# Patient Record
Sex: Female | Born: 1935 | Race: White | Hispanic: No | Smoking: Never smoker
Health system: Southern US, Community
[De-identification: ages and names within clinical notes are randomized; demographics above are authoritative.]

## PROBLEM LIST (undated history)

## (undated) DIAGNOSIS — I10 Essential (primary) hypertension: Secondary | ICD-10-CM

## (undated) DIAGNOSIS — J40 Bronchitis, not specified as acute or chronic: Secondary | ICD-10-CM

## (undated) DIAGNOSIS — J45909 Unspecified asthma, uncomplicated: Secondary | ICD-10-CM

## (undated) DIAGNOSIS — E785 Hyperlipidemia, unspecified: Secondary | ICD-10-CM

## (undated) HISTORY — PX: AORTIC VALVE REPAIR: SHX6306

## (undated) HISTORY — PX: REPLACEMENT TOTAL KNEE: SUR1224

---

## 2008-08-14 ENCOUNTER — Emergency Department (HOSPITAL_COMMUNITY): Admission: EM | Admit: 2008-08-14 | Discharge: 2008-08-14 | Payer: Self-pay | Admitting: Emergency Medicine

## 2008-08-15 ENCOUNTER — Ambulatory Visit (HOSPITAL_COMMUNITY): Admission: EM | Admit: 2008-08-15 | Discharge: 2008-08-15 | Payer: Self-pay | Admitting: Emergency Medicine

## 2010-05-28 LAB — COMPREHENSIVE METABOLIC PANEL
ALT: 17 U/L (ref 0–35)
Albumin: 3.4 g/dL — ABNORMAL LOW (ref 3.5–5.2)
BUN: 28 mg/dL — ABNORMAL HIGH (ref 6–23)
Chloride: 110 mEq/L (ref 96–112)
GFR calc non Af Amer: 41 mL/min — ABNORMAL LOW (ref >60)
Glucose, Bld: 115 mg/dL — ABNORMAL HIGH (ref 70–99)
Potassium: 4.4 mEq/L (ref 3.5–5.1)
Sodium: 141 mEq/L (ref 135–145)

## 2010-05-28 LAB — URINALYSIS, ROUTINE W REFLEX MICROSCOPIC
Bilirubin Urine: NEGATIVE
Glucose, UA: NEGATIVE mg/dL
Ketones, ur: NEGATIVE mg/dL
Nitrite: NEGATIVE
Nitrite: NEGATIVE
Protein, ur: 100 mg/dL — AB
Protein, ur: 100 mg/dL — AB
Specific Gravity, Urine: 1.02 (ref 1.005–1.030)
Specific Gravity, Urine: 1.028 (ref 1.005–1.030)
Urobilinogen, UA: 0.2 mg/dL (ref 0.0–1.0)
Urobilinogen, UA: 0.2 mg/dL (ref 0.0–1.0)
pH: 5 (ref 5.0–8.0)

## 2010-05-28 LAB — CBC
HCT: 33.8 % — ABNORMAL LOW (ref 36.0–46.0)
HCT: 34.1 % — ABNORMAL LOW (ref 36.0–46.0)
Hemoglobin: 11.4 g/dL — ABNORMAL LOW (ref 12.0–15.0)
Hemoglobin: 11.4 g/dL — ABNORMAL LOW (ref 12.0–15.0)
MCHC: 33.8 g/dL (ref 30.0–36.0)
MCV: 90.7 fL (ref 78.0–100.0)
Platelets: 172 K/uL (ref 150–400)
Platelets: 203 10*3/uL (ref 150–400)
RBC: 3.73 MIL/uL — ABNORMAL LOW (ref 3.87–5.11)
RDW: 13.8 % (ref 11.5–15.5)
WBC: 9.2 10*3/uL (ref 4.0–10.5)
WBC: 9.9 10*3/uL (ref 4.0–10.5)

## 2010-05-28 LAB — BASIC METABOLIC PANEL WITH GFR
BUN: 30 mg/dL — ABNORMAL HIGH (ref 6–23)
CO2: 24 meq/L (ref 19–32)
Calcium: 9.5 mg/dL (ref 8.4–10.5)
Creatinine, Ser: 1 mg/dL (ref 0.4–1.2)
GFR calc non Af Amer: 54 mL/min — ABNORMAL LOW (ref >60)
Glucose, Bld: 117 mg/dL — ABNORMAL HIGH (ref 70–99)
Sodium: 137 meq/L (ref 135–145)

## 2010-05-28 LAB — DIFFERENTIAL
Basophils Absolute: 0 K/uL (ref 0.0–0.1)
Basophils Relative: 0 % (ref 0–1)
Eosinophils Absolute: 0.2 10*3/uL (ref 0.0–0.7)
Eosinophils Relative: 2 % (ref 0–5)
Eosinophils Relative: 2 % (ref 0–5)
Lymphocytes Relative: 13 % (ref 12–46)
Lymphocytes Relative: 9 % — ABNORMAL LOW (ref 12–46)
Lymphs Abs: 0.9 10*3/uL (ref 0.7–4.0)
Lymphs Abs: 1.2 10*3/uL (ref 0.7–4.0)
Monocytes Absolute: 0.6 K/uL (ref 0.1–1.0)
Monocytes Relative: 6 % (ref 3–12)
Neutro Abs: 8.2 K/uL — ABNORMAL HIGH (ref 1.7–7.7)
Neutrophils Relative %: 76 % (ref 43–77)
Neutrophils Relative %: 83 % — ABNORMAL HIGH (ref 43–77)

## 2010-05-28 LAB — URINE CULTURE
Colony Count: NO GROWTH
Culture: NO GROWTH

## 2010-05-28 LAB — POCT I-STAT, CHEM 8
Chloride: 108 mEq/L (ref 96–112)
HCT: 33 % — ABNORMAL LOW (ref 36.0–46.0)
Potassium: 4.3 mEq/L (ref 3.5–5.1)

## 2010-05-28 LAB — PROTIME-INR
INR: 1 (ref 0.00–1.49)
Prothrombin Time: 13.7 seconds (ref 11.6–15.2)

## 2010-05-28 LAB — URINE MICROSCOPIC-ADD ON

## 2010-05-28 LAB — BASIC METABOLIC PANEL
Chloride: 106 mEq/L (ref 96–112)
GFR calc Af Amer: >60 mL/min (ref >60)
Potassium: 4.7 mEq/L (ref 3.5–5.1)

## 2010-05-29 IMAGING — CT CT ABDOMEN W/O CM
2 of 4 series · 17 of 46 positions shown, 19 images · non-contrast
Comparison: None

CT ABDOMEN

CLINICAL DATA: Left flank pain

CT OF THE ABDOMEN AND PELVIS WITHOUT CONTRAST (CT UROGRAM)
TECHNIQUE: Multidetector CT imaging was performed through the
abdomen and pelvis to include the urinary tract.

[Series 2: stone_wo 5.0 b40f st · axial · 0.77mm/px · z∈[-488,-84]mm · 14 of 89 slices shown, 16 images]
[im 4/89  soft-tissue]
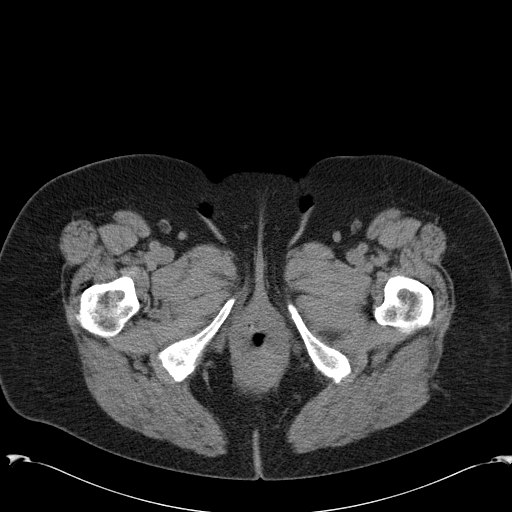
[im 4/89  bone]
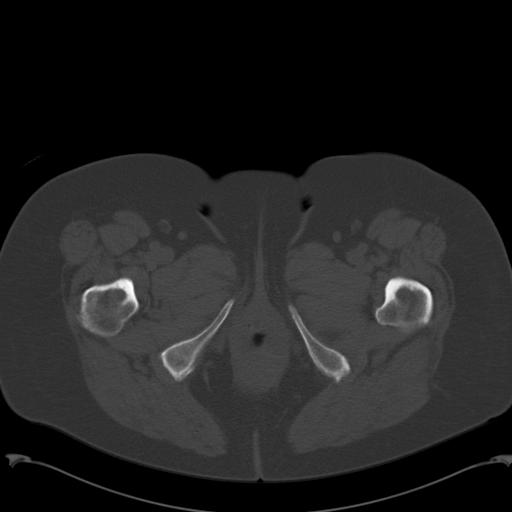
[im 12/89  soft-tissue]
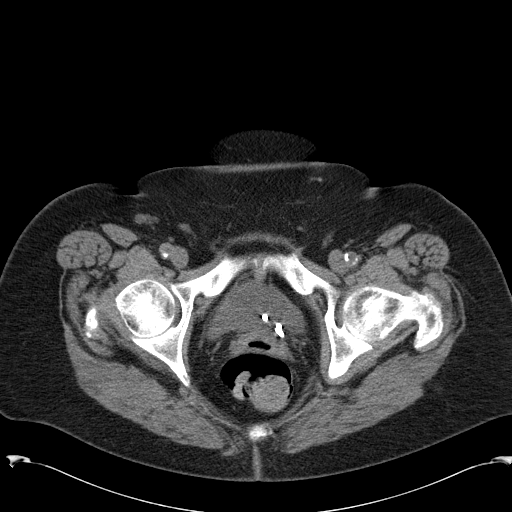
[im 19/89  soft-tissue]
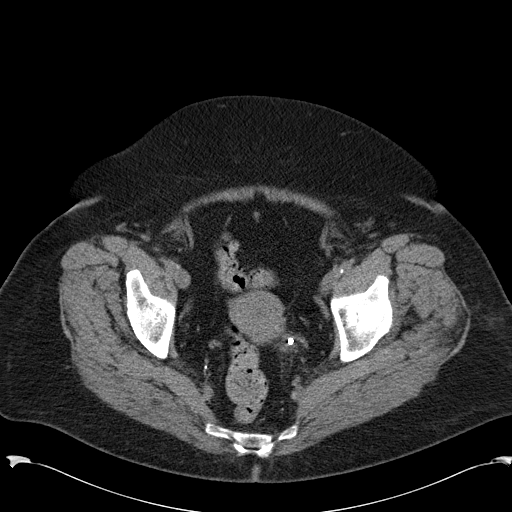
[im 23/89  soft-tissue]
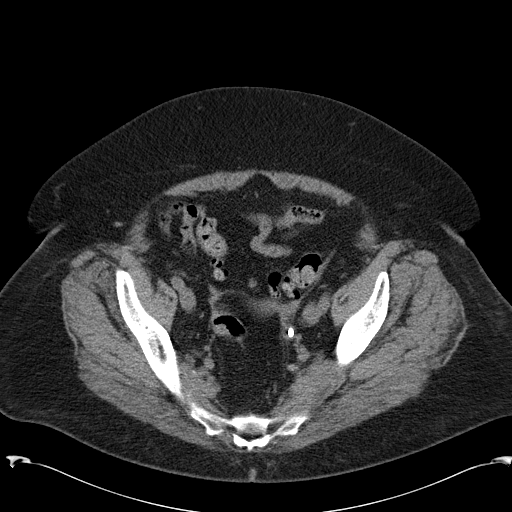
[im 30/89  soft-tissue]
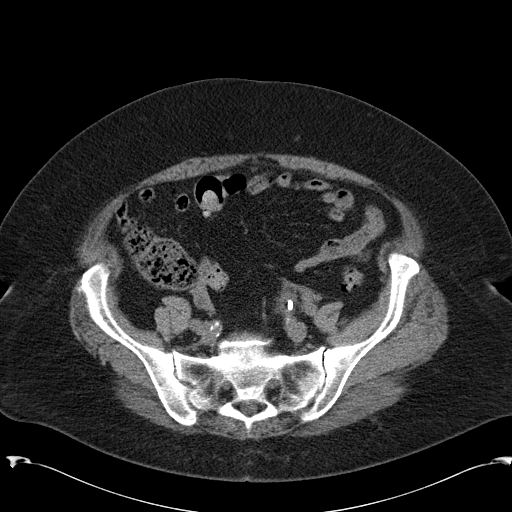
[im 37/89  soft-tissue]
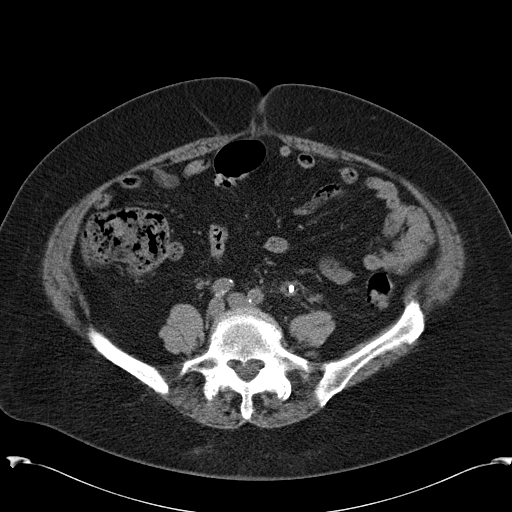
[im 41/89  soft-tissue]
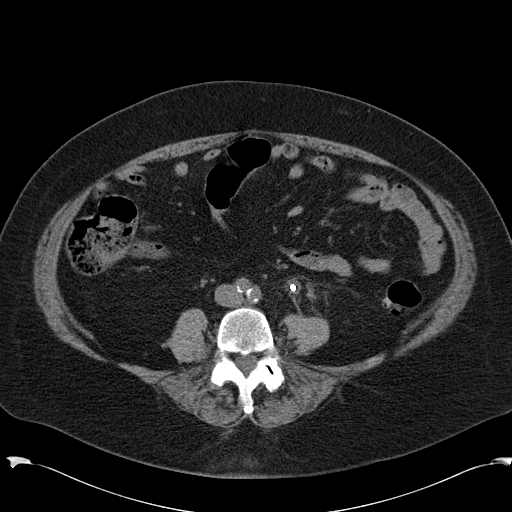
[im 48/89  soft-tissue]
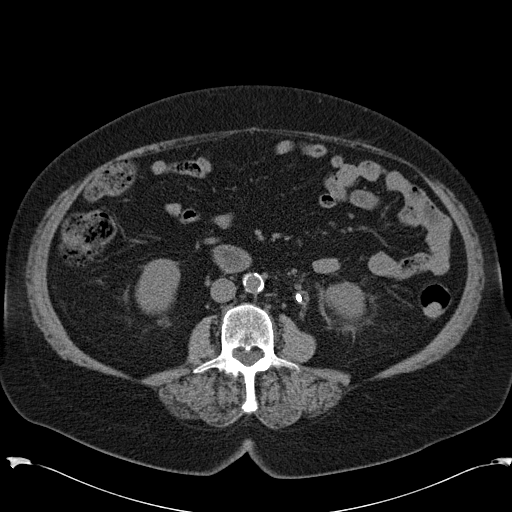
[im 52/89  soft-tissue]
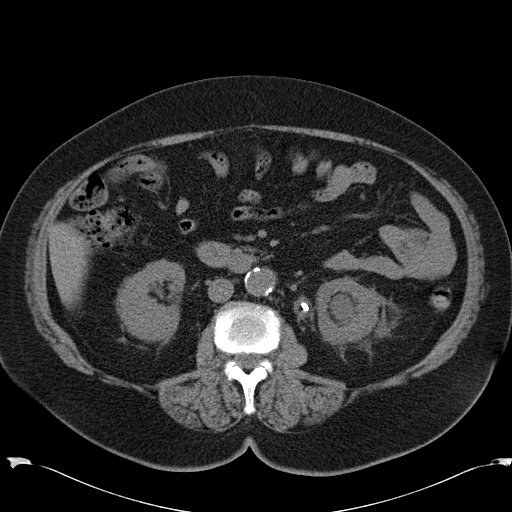
[im 52/89  bone]
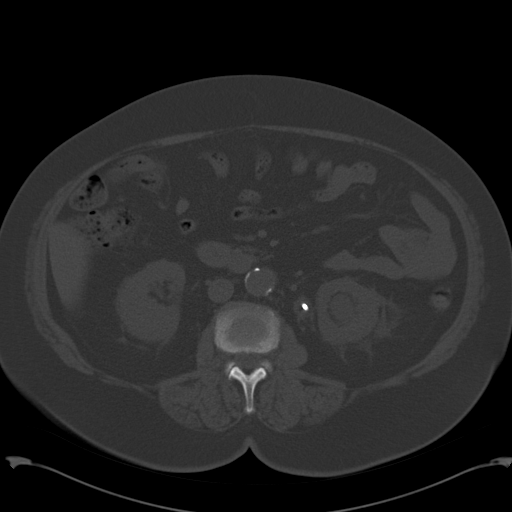
[im 59/89  soft-tissue]
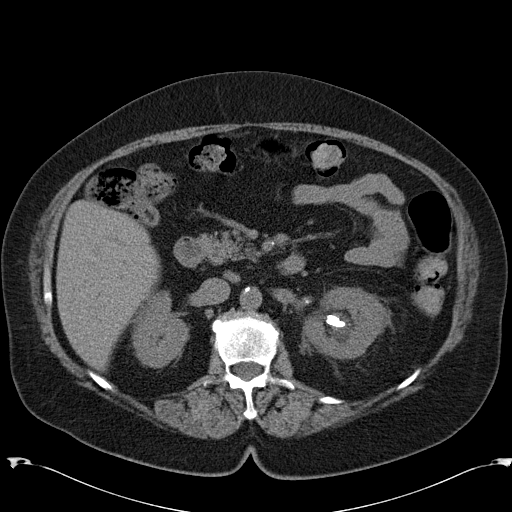
[im 67/89  soft-tissue]
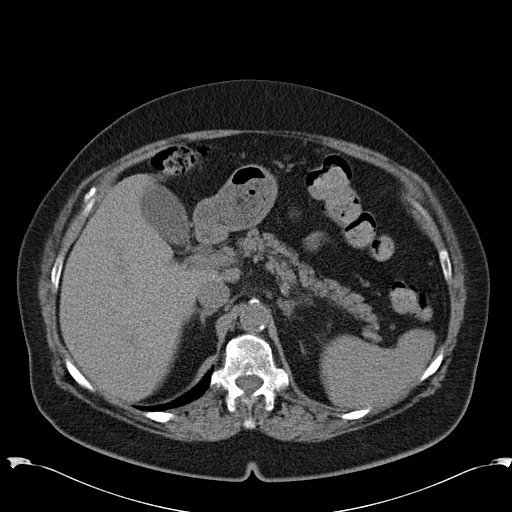
[im 70/89  soft-tissue]
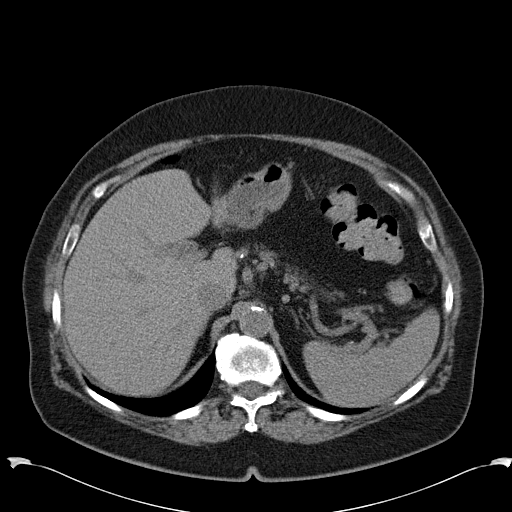
[im 78/89  soft-tissue]
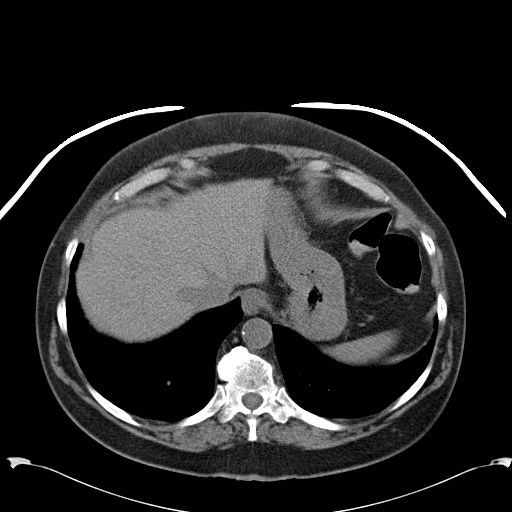
[im 85/89  soft-tissue]
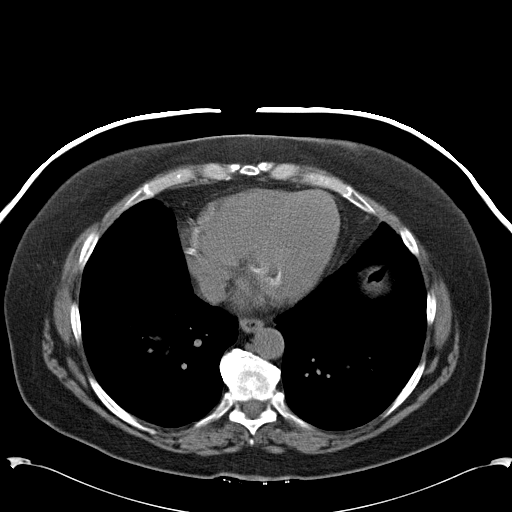

[Series 602: coronal abdomen · coronal · 0.90mm/px · 3 of 133 slices shown]
[im 45/133  soft-tissue]
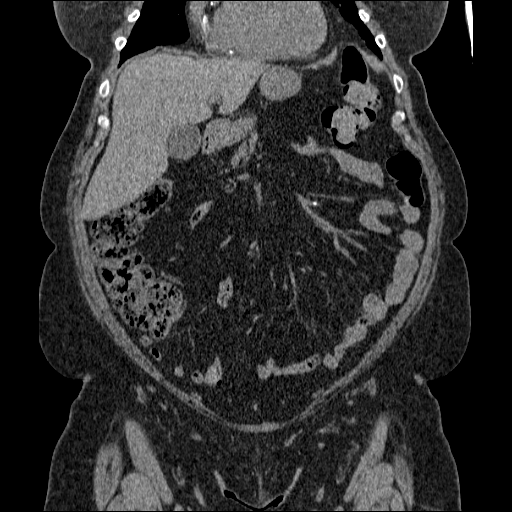
[im 59/133  soft-tissue]
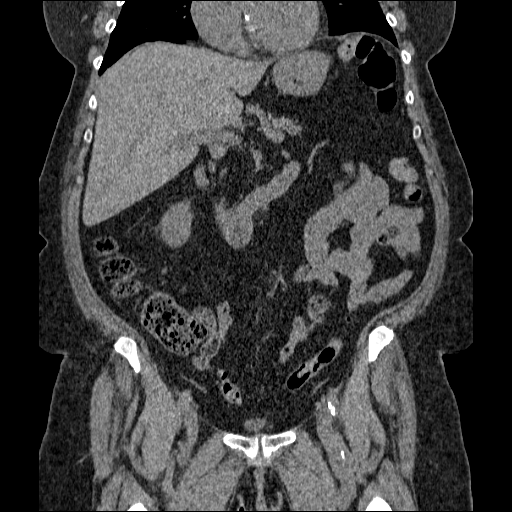
[im 74/133  soft-tissue]
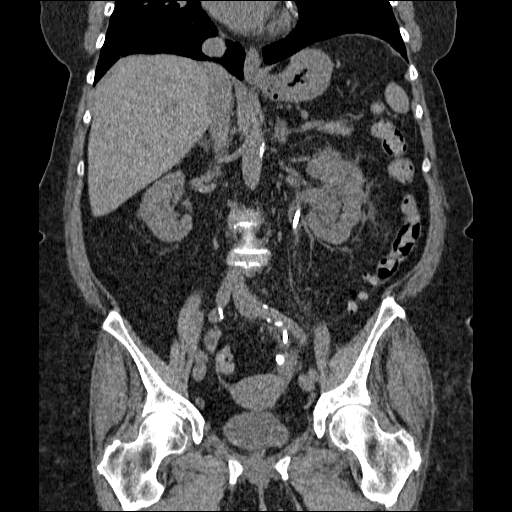

[17 of 46 positions shown; findings below may reference images not displayed]

FINDINGS: Dense coronary artery and mitral valve calcifications
noted.  Lung bases clear.  No effusions.

Left ureteral stent is in place.  There is moderate left
hydronephrosis and perinephric stranding.  No hydronephrosis on the
right.

Liver, spleen, pancreas, adrenals, gallbladder unremarkable. Bowel
grossly unremarkable.  No free fluid, free air, or adenopathy.

Degenerative changes in the lumbar spine.
IMPRESSION: Moderate left hydronephrosis with left ureteral stent in place.

Coronary artery disease.

CT PELVIS
FINDINGS: Left ureteral stent ends in the bladder.  Bladder is
decompressed.  Uterus and adnexa unremarkable. Bowel grossly
unremarkable.  No free fluid, free air, or adenopathy. No acute
bony abnormality.
IMPRESSION: Left ureteral stent ends in the bladder.

No acute findings in the pelvis.

## 2010-07-03 NOTE — Op Note (Signed)
[name], [name] NO.:  [id number]   MEDICAL RECORD NO.:  [id number]          PATIENT TYPE:  EMS   LOCATION:  ED                           FACILITY:  [facility]   PHYSICIAN:  Courtney Paris, M.D.DATE OF BIRTH:  Dec 09, 1929   DATE OF PROCEDURE:  DATE OF DISCHARGE:                               OPERATIVE REPORT   ADDENDUM:   DICTATION NUMBER:  [id number].  Send report to noted physician.      Courtney Paris, M.D.  Electronically Signed     HMK/MEDQ  D:  08/15/2008  T:  08/15/2008  Job:  [id number]   cc:   Magdalene Patricia, MD  [address]  [address]  [place], [place] [address]  Fax #[phone]

## 2010-07-03 NOTE — Op Note (Signed)
[name], [name]              ACCOUNT NO.:  [id number]   MEDICAL RECORD NO.:  [id number]          PATIENT TYPE:  EMS   LOCATION:  ED                           FACILITY:  [facility]   PHYSICIAN:  Courtney Paris, M.D.DATE OF BIRTH:  [birth year]   DATE OF PROCEDURE:  08/15/2008  DATE OF DISCHARGE:                               OPERATIVE REPORT   PREOPERATIVE DIAGNOSES:  Left hydronephrosis with ureteral stent.   POSTOPERATIVE DIAGNOSES:  1. Left hydronephrosis with ureteral stent.  2. Obstructed left ureteral stent.   ANESTHESIA:  General.   SURGEON:  Courtney Paris, M.D.   PROCEDURE:  Cystoscopy and removed of left ureteral obstructed stent.   BRIEF HISTORY:  The patient is a 77 year old white female from  [place] visiting in [place], who has had two visits to the  emergency room with left flank pain.  She had a stone with a stent on  the left in September 2009.  The stent was removed after 3 weeks, but  reinserted 3 weeks ago due to questionable obstruction.  CT scan in the  emergency room showed left hydronephrosis.  The stent was in good  position, but there was no stone.  She is admitted now for cystoscopy  and exchange of left ureteral stent before she travels back to  [place].   The patient was placed on the operating table in dorsal lithotomy  position.  After satisfactory induction of general endotracheal  anesthesia, she was prepped and draped with Betadine in the usual  sterile fashion.  She had gotten Cipro IV just prior to the procedure.  A timeout was performed and the patient and procedure were then  reconfirmed.  The 21 panendoscope was inserted in the bladder and the  bladder carefully inspected.  There were no bladder mucosal lesions  seen.  The stent coming out the left ureteral orifice had some  calcifications, but also had a string stent attached to it that had been  cut, but still attached to the end of the stent.  This was grasped  and  pulled outside the urethral meatus.  The end of the stent was then cut  off and there was a hydronephrotic drip obtained.  The guidewire was  then passed under fluoroscopy up to the level of the kidney and the  stent was then removed.  Over the guidewire a new 6-French x 24 cm  length double-J ureteral stent was then passed also under fluoroscopy up  to level of the kidney.  When the guidewire was removed, the stent  seemed to be in good position.  There was a nice coil in the renal  pelvis and one in the bladder.  Pictures were made of the preoperative  and postoperative stent.  The bladder was drained, B and O suppository  inserted.  She was given 30 mg of Toradol.  She will be later discharged  as an outpatient.  She was sent to the recovery room in good condition.      Courtney Paris, M.D.  Electronically Signed    HMK/MEDQ  D:  08/15/2008  T:  08/15/2008  Job:  [id number]

## 2010-07-03 NOTE — Consult Note (Signed)
[name], [name] NO.:  [id number]   MEDICAL RECORD NO.:  [id number]          PATIENT TYPE:  EMS   LOCATION:  ED                           FACILITY:  [facility]   PHYSICIAN:  Theodosia Paling, MD    DATE OF BIRTH:  1930-01-12   DATE OF CONSULTATION:  08/15/2008  DATE OF DISCHARGE:                                 CONSULTATION   PRIMARY CARE PHYSICIAN:  Unassigned.  The patient is visiting from  [place].   REASON FOR CONSULTATION:  Preoperative clearance and management of  comorbid medical condition.   HISTORY OF PRESENT ILLNESS:  [name] is a very pleasant 77-  year-old lady with a past medical history of CAD and hypertension and a  recent history of left-sided ureteral stent placement 2 weeks back was  in her usual state of health until around 3 days back when she had 10/10  left frank pain radiating to the back.  She presented to the emergency  room with flank pain and was discharged with pain medications and  antibiotics.  However, her pain persisted.  This prompted her to arrive  to emergency room for further evaluation and management.  In the ER she  underwent imaging which revealed left-sided hydronephrosis.  Urology was  consulted who is going to perform the repositioning or stent placement  and address issue of hydronephrosis.  [facility] team was  contacted for consultation for preoperative clearance and for medical  management.   REVIEW OF SYSTEMS:  Essentially negative except for what is mentioned in  HPI.  In addition the patient feels intermittent nausea as well.   PAST MEDICAL HISTORY:  1. Significant for coronary artery disease 2 years back.  According to      her she has 2 coronary stents placed.  2. History of hypertension.  3. History of hyperlipidemia.   PAST SURGICAL HISTORY:  1. Two weeks back left kidney stent placed in the left ureter.  2. History of tonsillectomy.  3. History of appendectomy.   SOCIAL  HISTORY:  Denies history of tobacco, alcohol or IV drug abuse.  Lives alone and is very functional.   ALLERGIES:  No known drug allergies.   HOME MEDICATIONS:  The patient does not remember the dosages, but she  takes the following medications.  1. Oxybutynin.  2. Metoprolol.  3. Crestor.   FAMILY HISTORY:  Both parents died of heart disease, she is not sure  what kind of disease they had.   PHYSICAL EXAMINATION:  VITAL SIGNS:  Blood pressure 152/60, heart rate  44, respiratory rate 20, afebrile temperature 97.9, heart rate is sinus  bradycardia.  GENERAL:  No acute cardiorespiratory distress.  HEENT:  No ear or nose discharge.  EOM intact.  LUNGS:  Normal breath sounds.  CARDIOVASCULAR:  S1-S2 normal.  No murmur or gallop heard.  ABDOMEN:  Nontender.  No organomegaly appreciated.  EXTREMITIES:  No pedal edema.  PSYCH:  Oriented x3.  NEUROLOGY:  Motor, sensory and speech intact.  Follows commands.   LABORATORY DATA:  WBC 9.9, hemoglobin 11.4, hematocrit 33.8, platelet  count 172.  Sodium 137, potassium 4.7, chloride 106, bicarbonate 24,  glucose 117, BUN 30, creatinine 1.0, calcium 9.4.  Urinalysis showing  large blood, 100 protein, small leukocytes, many squamous cells, many  bacteria.   IMAGING:  CT scan of the abdomen and pelvis performed on August 14, 2008  showing left ureteral stent being in the bladder, moderate left  hydronephrosis.   ASSESSMENT/PLAN:  1. Hydronephrosis.  Urology is planning to do the procedure this      afternoon in terms of her hydronephrosis.  At this time there is no      evidence of acute renal failure or sepsis.  We will continue to      monitor closely and continue IV antibiotics as recommended by      urology.  2. Hypertension.  Stable at this time.  3. Coronary artery disease.  Stable at this time.  4. Anemia.  The patient appears to be anemic.  This appears to be      normocytic anemia.  This can be worked up as an outpatient with the       primary care Arnold Kester.  5. Prophylaxis.  Deep venous thrombosis and gastrointestinal      prophylaxis is recommended.   Total time spent in consult of this patient was around 1 hour.  The  patient has moderate risk for surgery given age and comorbid condition.  We will continue to follow postoperatively with you.      Theodosia Paling, MD  Electronically Signed     NP/MEDQ  D:  08/15/2008  T:  08/15/2008  Job:  [id number]   cc:   Courtney Paris, M.D.  Fax: [phone]

## 2017-02-24 ENCOUNTER — Encounter (HOSPITAL_BASED_OUTPATIENT_CLINIC_OR_DEPARTMENT_OTHER): Payer: Self-pay | Admitting: Adult Health

## 2017-02-24 ENCOUNTER — Emergency Department (HOSPITAL_BASED_OUTPATIENT_CLINIC_OR_DEPARTMENT_OTHER)
Admission: EM | Admit: 2017-02-24 | Discharge: 2017-02-24 | Disposition: A | Discharge status: Home / Self Care | Payer: Medicare Other | Attending: Emergency Medicine | Admitting: Emergency Medicine

## 2017-02-24 ENCOUNTER — Other Ambulatory Visit: Payer: Self-pay

## 2017-02-24 ENCOUNTER — Emergency Department (HOSPITAL_BASED_OUTPATIENT_CLINIC_OR_DEPARTMENT_OTHER): Payer: Medicare Other

## 2017-02-24 DIAGNOSIS — J189 Pneumonia, unspecified organism: Secondary | ICD-10-CM

## 2017-02-24 DIAGNOSIS — J181 Lobar pneumonia, unspecified organism: Secondary | ICD-10-CM | POA: Diagnosis not present

## 2017-02-24 DIAGNOSIS — J45909 Unspecified asthma, uncomplicated: Secondary | ICD-10-CM | POA: Diagnosis not present

## 2017-02-24 DIAGNOSIS — R509 Fever, unspecified: Secondary | ICD-10-CM | POA: Diagnosis present

## 2017-02-24 DIAGNOSIS — J111 Influenza due to unidentified influenza virus with other respiratory manifestations: Secondary | ICD-10-CM

## 2017-02-24 DIAGNOSIS — Z7982 Long term (current) use of aspirin: Secondary | ICD-10-CM | POA: Insufficient documentation

## 2017-02-24 DIAGNOSIS — Z79899 Other long term (current) drug therapy: Secondary | ICD-10-CM | POA: Diagnosis not present

## 2017-02-24 DIAGNOSIS — I10 Essential (primary) hypertension: Secondary | ICD-10-CM | POA: Insufficient documentation

## 2017-02-24 DIAGNOSIS — R69 Illness, unspecified: Secondary | ICD-10-CM

## 2017-02-24 HISTORY — DX: Unspecified asthma, uncomplicated: J45.909

## 2017-02-24 HISTORY — DX: Bronchitis, not specified as acute or chronic: J40

## 2017-02-24 HISTORY — DX: Essential (primary) hypertension: I10

## 2017-02-24 HISTORY — DX: Hyperlipidemia, unspecified: E78.5

## 2017-02-24 LAB — COMPREHENSIVE METABOLIC PANEL
ALBUMIN: 3.4 g/dL — AB (ref 3.5–5.0)
ALT: 21 U/L (ref 14–54)
AST: 30 U/L (ref 15–41)
Alkaline Phosphatase: 72 U/L (ref 38–126)
Anion gap: 9 (ref 5–15)
BUN: 20 mg/dL (ref 6–20)
CHLORIDE: 103 mmol/L (ref 101–111)
CO2: 23 mmol/L (ref 22–32)
Calcium: 8.8 mg/dL — ABNORMAL LOW (ref 8.9–10.3)
Creatinine, Ser: 1.06 mg/dL — ABNORMAL HIGH (ref 0.44–1.00)
GFR calc Af Amer: 54 mL/min — ABNORMAL LOW (ref >60)
GFR calc non Af Amer: 47 mL/min — ABNORMAL LOW (ref >60)
GLUCOSE: 133 mg/dL — AB (ref 65–99)
POTASSIUM: 3.8 mmol/L (ref 3.5–5.1)
Sodium: 135 mmol/L (ref 135–145)
Total Bilirubin: 1 mg/dL (ref 0.3–1.2)
Total Protein: 6.2 g/dL — ABNORMAL LOW (ref 6.5–8.1)

## 2017-02-24 LAB — CBC WITH DIFFERENTIAL/PLATELET
Basophils Absolute: 0.1 10*3/uL (ref 0.0–0.1)
Basophils Relative: 1 %
EOS PCT: 1 %
Eosinophils Absolute: 0.1 10*3/uL (ref 0.0–0.7)
HCT: 33.3 % — ABNORMAL LOW (ref 36.0–46.0)
Hemoglobin: 10.9 g/dL — ABNORMAL LOW (ref 12.0–15.0)
LYMPHS ABS: 0.5 10*3/uL — AB (ref 0.7–4.0)
Lymphocytes Relative: 6 %
MCH: 30.4 pg (ref 26.0–34.0)
MCHC: 32.7 g/dL (ref 30.0–36.0)
MCV: 92.8 fL (ref 78.0–100.0)
MONO ABS: 1 10*3/uL (ref 0.1–1.0)
Monocytes Relative: 12 %
Neutro Abs: 6.7 10*3/uL (ref 1.7–7.7)
Neutrophils Relative %: 80 %
PLATELETS: 136 10*3/uL — AB (ref 150–400)
RBC: 3.59 MIL/uL — ABNORMAL LOW (ref 3.87–5.11)
RDW: 13.8 % (ref 11.5–15.5)
WBC: 8.4 10*3/uL (ref 4.0–10.5)

## 2017-02-24 LAB — I-STAT CG4 LACTIC ACID, ED: Lactic Acid, Venous: 0.62 mmol/L (ref 0.5–1.9)

## 2017-02-24 MED ORDER — LEVOFLOXACIN 500 MG PO TABS: 500.0000 mg | ORAL_TABLET | Freq: Every day | ORAL | 0 refills | Status: AC

## 2017-02-24 MED ORDER — SODIUM CHLORIDE 0.9 % IV BOLUS (SEPSIS)
500.0000 mL | Freq: Once | INTRAVENOUS | Status: AC
  Administered 2017-02-24: 500 mL via INTRAVENOUS

## 2017-02-24 MED ORDER — LEVOFLOXACIN 500 MG PO TABS
500.0000 mg | ORAL_TABLET | Freq: Once | ORAL | Status: AC
  Administered 2017-02-24: 500 mg via ORAL
  Filled 2017-02-24: qty 1

## 2017-02-24 MED ORDER — OSELTAMIVIR PHOSPHATE 75 MG PO CAPS: 75.0000 mg | ORAL_CAPSULE | Freq: Two times a day (BID) | ORAL | 0 refills | Status: AC

## 2017-02-24 MED ORDER — ACETAMINOPHEN 325 MG PO TABS
650.0000 mg | ORAL_TABLET | Freq: Once | ORAL | Status: AC
  Administered 2017-02-24: 650 mg via ORAL
  Filled 2017-02-24: qty 2

## 2017-02-24 NOTE — ED Notes (Signed)
Ambulating with RT, alert, NAD, calm, steady gait. Family present.

## 2017-02-24 NOTE — ED Provider Notes (Signed)
[facility] EMERGENCY DEPARTMENT Provider Note   CSN: [id number] Arrival date & time: 02/24/17  0026     History   Chief Complaint Chief Complaint  Patient presents with  . Fever    HPI [name] is a 84 y.o. female.  HPI  This is an 84 year old female with a history of hypertension, hyperlipidemia, asthma, colon cancer who presents with myalgias, fever, and cough.  Onset of symptoms over the last 24 hours.  Patient reports cold chills at home and generalized myalgias.  She did take Tylenol with minimal relief.  She reports cough productive of green sputum.  No recent hospitalizations.  She is traveling and visiting her sister.  Denies any chest pain.  She does report some shortness of breath.  No nausea, vomiting, abdominal pain, urinary symptoms.  She did not receive a flu shot this year.  Temperature noted to be 101.7 in triage.  Past Medical History:  Diagnosis Date  . Asthma   . Bronchitis   . Hyperlipidemia   . Hypertension     There are no active problems to display for this patient.   Past Surgical History:  Procedure Laterality Date  . AORTIC VALVE REPAIR    . REPLACEMENT TOTAL KNEE      OB History    No data available       Home Medications    Prior to Admission medications   Medication Sig Start Date End Date Taking? Authorizing Provider  aspirin 81 MG chewable tablet Chew 81 mg by mouth daily.   Yes [provider]  metoprolol tartrate (LOPRESSOR) 25 MG tablet Take 25 mg by mouth 2 (two) times daily.   Yes [provider]  rosuvastatin (CRESTOR) 10 MG tablet Take 10 mg by mouth daily.   Yes [provider]  levofloxacin (LEVAQUIN) 500 MG tablet Take 1 tablet (500 mg total) by mouth daily. 02/24/17   Daimon Kean, Mayer Masker, MD  oseltamivir (TAMIFLU) 75 MG capsule Take 1 capsule (75 mg total) by mouth every 12 (twelve) hours. 02/24/17   Kamylle Axelson, Mayer Masker, MD    Family History History reviewed. No pertinent family  history.  Social History Social History   Tobacco Use  . Smoking status: Never Smoker  Substance Use Topics  . Alcohol use: No    Frequency: Never  . Drug use: No     Allergies   Morphine and related   Review of Systems Review of Systems  Constitutional: Positive for chills and fever.  Respiratory: Positive for cough and shortness of breath.   Cardiovascular: Negative for chest pain.  Gastrointestinal: Negative for abdominal pain, diarrhea, nausea and vomiting.  Genitourinary: Negative for dysuria.  Musculoskeletal:       Myalgias  Skin: Negative for rash.  All other systems reviewed and are negative.    Physical Exam Updated Vital Signs BP 119/62 (BP Location: Left Arm)   Pulse 80   Temp 98.2 F (36.8 C) (Oral)   Resp 18   SpO2 95%   Physical Exam  Constitutional: She is oriented to person, place, and time. No distress.  Elderly, ill-appearing but nontoxic, no acute distress  HENT:  Head: Normocephalic and atraumatic.  Eyes: Pupils are equal, round, and reactive to light.  Cardiovascular: Normal rate, regular rhythm and normal heart sounds.  Pulmonary/Chest: Effort normal. No respiratory distress. She has wheezes.  Occasional expiratory wheeze  Abdominal: Soft. Bowel sounds are normal. There is no tenderness.  Musculoskeletal: She exhibits no edema.  Neurological:  She is alert and oriented to person, place, and time.  Skin: Skin is warm and dry. No rash noted.  Psychiatric: She has a normal mood and affect.  Nursing note and vitals reviewed.    ED Treatments / Results  Labs (all labs ordered are listed, but only abnormal results are displayed) Labs Reviewed  COMPREHENSIVE METABOLIC PANEL - Abnormal; Notable for the following components:      Result Value   Glucose, Bld 133 (*)    Creatinine, Ser 1.06 (*)    Calcium 8.8 (*)    Total Protein 6.2 (*)    Albumin 3.4 (*)    GFR calc non Af Amer 47 (*)    GFR calc Af Amer 54 (*)    All other  components within normal limits  CBC WITH DIFFERENTIAL/PLATELET - Abnormal; Notable for the following components:   RBC 3.59 (*)    Hemoglobin 10.9 (*)    HCT 33.3 (*)    Platelets 136 (*)    Lymphs Abs 0.5 (*)    All other components within normal limits  I-STAT CG4 LACTIC ACID, ED  I-STAT CG4 LACTIC ACID, ED    EKG  EKG Interpretation None       Radiology Dg Chest 2 View  Result Date: 02/24/2017 CLINICAL DATA:  84 y/o F; cough, nasal congestion, increased urinary frequency, and fever for 2 days. EXAM: CHEST  2 VIEW COMPARISON:  None. FINDINGS: Aortic valve replacement. Sternotomy wires are aligned and intact. No acute osseous abnormality identified. Ill-defined left basilar infiltrate. No pleural effusion or pneumothorax. IMPRESSION: Ill-defined left basilar infiltrate may represent pneumonia. Electronically Signed   By: Mitzi HansenLance  Furusawa-Stratton M.D.   On: 02/24/2017 02:00    Procedures Procedures (including critical care time)  Medications Ordered in ED Medications  acetaminophen (TYLENOL) tablet 650 mg (650 mg Oral Given 02/24/17 0051)  sodium chloride 0.9 % bolus 500 mL (0 mLs Intravenous Stopped 02/24/17 0248)  levofloxacin (LEVAQUIN) tablet 500 mg (500 mg Oral Given 02/24/17 0258)     Initial Impression / Assessment and Plan / ED Course  I have reviewed the triage vital signs and the nursing notes.  Pertinent labs & imaging results that were available during my care of the patient were reviewed by me and considered in my medical decision making (see chart for details).    Patient presents with myalgias, chills, and cough.  Noted to be febrile in triage.  Sepsis workup initiated.  Initial vital signs largely reassuring.  Patient was given Tylenol.  Lactate is normal.  She was given some fluids.  Basic lab work is largely reassuring.  No significant leukocytosis.  There is likely a small infiltrate on her x-ray.  This would clinically correlate with her symptoms.  We will  treat for community-acquired pneumonia.  Given systemic symptoms, would also consider influenza.  On recheck, patient states that she feels better and her vital signs have normalized.  She is able to ambulate and maintain her pulse ox.  Will discharge home with Levaquin and Tamiflu.  Discussed at length with the patient and her sister return precautions if she worsens.  After history, exam, and medical workup I feel the patient has been appropriately medically screened and is safe for discharge home. Pertinent diagnoses were discussed with the patient. Patient was given return precautions.   Final Clinical Impressions(s) / ED Diagnoses   Final diagnoses:  Community acquired pneumonia of left lower lobe of lung (HCC)  Influenza-like illness    ED  Discharge Orders        Ordered    levofloxacin (LEVAQUIN) 500 MG tablet  Daily     02/24/17 0350    oseltamivir (TAMIFLU) 75 MG capsule  Every 12 hours     02/24/17 0350       Jalexa Pifer, Mayer Masker, MD 02/24/17 [phone]

## 2017-02-24 NOTE — Discharge Instructions (Signed)
You were seen today for fever.  Your x-ray shows an of the flu.  You will be treated with antibiotics and Tamiflu.  You need to return immediately if your symptoms worsen.  Make sure that you stay hydrated.

## 2017-02-24 NOTE — ED Notes (Signed)
Assisted pt with getting dressed and to the car via w/c. Tolerated well.

## 2017-02-24 NOTE — ED Triage Notes (Signed)
Presents with fever, cough and rhinorhea, generalized body aches and fatigue began today. SHe has been taking ASpirin at home for pain and fever. Cough productive.

## 2017-02-24 NOTE — ED Notes (Signed)
EDP into room to update pt with results and plan. [facility]

## 2017-02-24 NOTE — Progress Notes (Signed)
Patient ambulated around the department while on pulse ox.  Patient's SPO2 remained between 94% and 95% and her heart rate did not exceed 106.

## 2017-02-24 NOTE — ED Notes (Addendum)
[facility] Alert, NAD, calm, interactive, resps e/u, speaking in clear complete sentences, no dyspnea noted, skin W&D, VSS, c/o fever, cough, sob, HA, body aches, fatigued, and lightheaded, (denies: NVD, syncope, dizziness, or rash). [facility] room. [facility]
